# Patient Record
Sex: Female | Born: 1970 | Race: Black or African American | Hispanic: No | Marital: Married | Smoking: Never smoker
Health system: Southern US, Community
[De-identification: ages and names within clinical notes are randomized; demographics above are authoritative.]

## PROBLEM LIST (undated history)

## (undated) DIAGNOSIS — R519 Headache, unspecified: Secondary | ICD-10-CM

## (undated) DIAGNOSIS — T7840XA Allergy, unspecified, initial encounter: Secondary | ICD-10-CM

## (undated) DIAGNOSIS — E7800 Pure hypercholesterolemia, unspecified: Secondary | ICD-10-CM

## (undated) DIAGNOSIS — R51 Headache: Secondary | ICD-10-CM

## (undated) HISTORY — DX: Headache, unspecified: R51.9

## (undated) HISTORY — PX: ABDOMINAL HYSTERECTOMY: SHX81

## (undated) HISTORY — DX: Allergy, unspecified, initial encounter: T78.40XA

## (undated) HISTORY — DX: Headache: R51

---

## 1999-07-29 ENCOUNTER — Emergency Department (HOSPITAL_COMMUNITY): Admission: EM | Admit: 1999-07-29 | Discharge: 1999-07-30 | Payer: Self-pay | Admitting: Emergency Medicine

## 1999-07-30 ENCOUNTER — Encounter: Payer: Self-pay | Admitting: Emergency Medicine

## 2000-03-18 ENCOUNTER — Other Ambulatory Visit: Admission: RE | Admit: 2000-03-18 | Discharge: 2000-03-18 | Payer: Self-pay | Admitting: Obstetrics and Gynecology

## 2000-03-20 ENCOUNTER — Encounter: Admission: RE | Admit: 2000-03-20 | Discharge: 2000-03-20 | Payer: Self-pay | Admitting: Obstetrics and Gynecology

## 2000-03-20 ENCOUNTER — Encounter: Payer: Self-pay | Admitting: Obstetrics and Gynecology

## 2001-06-09 ENCOUNTER — Other Ambulatory Visit: Admission: RE | Admit: 2001-06-09 | Discharge: 2001-06-09 | Payer: Self-pay | Admitting: Obstetrics and Gynecology

## 2002-08-19 ENCOUNTER — Other Ambulatory Visit: Admission: RE | Admit: 2002-08-19 | Discharge: 2002-08-19 | Payer: Self-pay | Admitting: Obstetrics and Gynecology

## 2002-09-23 ENCOUNTER — Encounter: Payer: Self-pay | Admitting: Obstetrics and Gynecology

## 2002-09-23 ENCOUNTER — Encounter: Admission: RE | Admit: 2002-09-23 | Discharge: 2002-09-23 | Payer: Self-pay | Admitting: Obstetrics and Gynecology

## 2003-08-02 ENCOUNTER — Inpatient Hospital Stay (HOSPITAL_COMMUNITY): Admission: RE | Admit: 2003-08-02 | Discharge: 2003-08-04 | Payer: Self-pay | Admitting: Obstetrics and Gynecology

## 2004-06-28 ENCOUNTER — Encounter: Admission: RE | Admit: 2004-06-28 | Discharge: 2004-06-28 | Payer: Self-pay | Admitting: Obstetrics and Gynecology

## 2004-09-01 ENCOUNTER — Emergency Department (HOSPITAL_COMMUNITY): Admission: EM | Admit: 2004-09-01 | Discharge: 2004-09-01 | Payer: Self-pay | Admitting: Family Medicine

## 2010-03-01 ENCOUNTER — Inpatient Hospital Stay (INDEPENDENT_AMBULATORY_CARE_PROVIDER_SITE_OTHER)
Admission: RE | Admit: 2010-03-01 | Discharge: 2010-03-01 | Disposition: A | Discharge status: Home / Self Care | Payer: 59 | Source: Ambulatory Visit | Attending: Family Medicine | Admitting: Family Medicine

## 2010-03-01 DIAGNOSIS — S139XXA Sprain of joints and ligaments of unspecified parts of neck, initial encounter: Secondary | ICD-10-CM

## 2011-03-24 ENCOUNTER — Other Ambulatory Visit: Payer: Self-pay | Admitting: Obstetrics and Gynecology

## 2011-03-24 DIAGNOSIS — Z1231 Encounter for screening mammogram for malignant neoplasm of breast: Secondary | ICD-10-CM

## 2011-04-09 ENCOUNTER — Ambulatory Visit
Admission: RE | Admit: 2011-04-09 | Discharge: 2011-04-09 | Disposition: A | Discharge status: Home / Self Care | Payer: 59 | Source: Ambulatory Visit | Attending: Obstetrics and Gynecology | Admitting: Obstetrics and Gynecology

## 2011-04-09 DIAGNOSIS — Z1231 Encounter for screening mammogram for malignant neoplasm of breast: Secondary | ICD-10-CM

## 2011-04-15 ENCOUNTER — Other Ambulatory Visit: Payer: Self-pay | Admitting: Obstetrics and Gynecology

## 2011-04-15 DIAGNOSIS — R928 Other abnormal and inconclusive findings on diagnostic imaging of breast: Secondary | ICD-10-CM

## 2011-04-18 ENCOUNTER — Ambulatory Visit
Admission: RE | Admit: 2011-04-18 | Discharge: 2011-04-18 | Disposition: A | Discharge status: Home / Self Care | Payer: 59 | Source: Ambulatory Visit | Attending: Obstetrics and Gynecology | Admitting: Obstetrics and Gynecology

## 2011-04-18 DIAGNOSIS — R928 Other abnormal and inconclusive findings on diagnostic imaging of breast: Secondary | ICD-10-CM

## 2012-03-16 ENCOUNTER — Other Ambulatory Visit: Payer: Self-pay | Admitting: Obstetrics and Gynecology

## 2012-04-12 ENCOUNTER — Ambulatory Visit
Admission: RE | Admit: 2012-04-12 | Discharge: 2012-04-12 | Disposition: A | Discharge status: Home / Self Care | Payer: 59 | Source: Ambulatory Visit | Attending: Obstetrics and Gynecology | Admitting: Obstetrics and Gynecology

## 2012-04-12 DIAGNOSIS — Z1231 Encounter for screening mammogram for malignant neoplasm of breast: Secondary | ICD-10-CM

## 2012-04-14 ENCOUNTER — Other Ambulatory Visit: Payer: Self-pay | Admitting: Obstetrics and Gynecology

## 2012-04-14 DIAGNOSIS — R928 Other abnormal and inconclusive findings on diagnostic imaging of breast: Secondary | ICD-10-CM

## 2012-04-26 ENCOUNTER — Ambulatory Visit
Admission: RE | Admit: 2012-04-26 | Discharge: 2012-04-26 | Disposition: A | Discharge status: Home / Self Care | Payer: 59 | Source: Ambulatory Visit | Attending: Obstetrics and Gynecology | Admitting: Obstetrics and Gynecology

## 2012-04-26 ENCOUNTER — Other Ambulatory Visit: Payer: Self-pay | Admitting: Obstetrics and Gynecology

## 2012-04-26 DIAGNOSIS — R928 Other abnormal and inconclusive findings on diagnostic imaging of breast: Secondary | ICD-10-CM

## 2013-04-20 ENCOUNTER — Other Ambulatory Visit: Payer: Self-pay | Admitting: Family Medicine

## 2013-04-20 ENCOUNTER — Ambulatory Visit
Admission: RE | Admit: 2013-04-20 | Discharge: 2013-04-20 | Disposition: A | Discharge status: Home / Self Care | Payer: 59 | Source: Ambulatory Visit | Attending: Family Medicine | Admitting: Family Medicine

## 2013-04-20 DIAGNOSIS — R0602 Shortness of breath: Secondary | ICD-10-CM

## 2013-06-07 ENCOUNTER — Other Ambulatory Visit: Payer: Self-pay | Admitting: Obstetrics and Gynecology

## 2013-06-07 DIAGNOSIS — N63 Unspecified lump in unspecified breast: Secondary | ICD-10-CM

## 2013-06-27 ENCOUNTER — Encounter (INDEPENDENT_AMBULATORY_CARE_PROVIDER_SITE_OTHER): Payer: Self-pay

## 2013-06-27 ENCOUNTER — Ambulatory Visit
Admission: RE | Admit: 2013-06-27 | Discharge: 2013-06-27 | Disposition: A | Discharge status: Home / Self Care | Payer: 59 | Source: Ambulatory Visit | Attending: Obstetrics and Gynecology | Admitting: Obstetrics and Gynecology

## 2013-06-27 DIAGNOSIS — N63 Unspecified lump in unspecified breast: Secondary | ICD-10-CM

## 2013-08-11 ENCOUNTER — Other Ambulatory Visit: Payer: Self-pay | Admitting: Family Medicine

## 2013-08-11 DIAGNOSIS — M542 Cervicalgia: Secondary | ICD-10-CM

## 2013-08-17 ENCOUNTER — Ambulatory Visit
Admission: RE | Admit: 2013-08-17 | Discharge: 2013-08-17 | Disposition: A | Discharge status: Home / Self Care | Payer: 59 | Source: Ambulatory Visit | Attending: Family Medicine | Admitting: Family Medicine

## 2013-08-17 DIAGNOSIS — M542 Cervicalgia: Secondary | ICD-10-CM

## 2013-08-19 ENCOUNTER — Other Ambulatory Visit: Payer: 59

## 2014-08-07 ENCOUNTER — Encounter (HOSPITAL_COMMUNITY): Payer: Self-pay | Admitting: Emergency Medicine

## 2014-08-07 ENCOUNTER — Emergency Department (HOSPITAL_COMMUNITY)
Admission: EM | Admit: 2014-08-07 | Discharge: 2014-08-07 | Disposition: A | Discharge status: Home / Self Care | Payer: 59 | Source: Home / Self Care | Attending: Family Medicine | Admitting: Family Medicine

## 2014-08-07 DIAGNOSIS — J301 Allergic rhinitis due to pollen: Secondary | ICD-10-CM

## 2014-08-07 LAB — POCT RAPID STREP A: Streptococcus, Group A Screen (Direct): NEGATIVE

## 2014-08-07 MED ORDER — IPRATROPIUM BROMIDE 0.06 % NA SOLN: 2.0000 | Freq: Four times a day (QID) | NASAL | Status: DC

## 2014-08-07 NOTE — ED Notes (Signed)
C/o cold sx onset 5 days Sx include ST, cough, nasal congestion, HA Denies fevers, chills Alert, no signs of acute distress.

## 2014-08-07 NOTE — Discharge Instructions (Signed)
Allergic Rhinitis May start taking Allegra 180 mg daily, Claritin 10 mg or Zyrtec 10 mg daily. (Just 1 of the receding). Flonase nasal spray daily Use lots of saline nasal spray and frequently For congestion may use Sudafed 10 PE 10 mg as needed Atrovent nasal spray as directed Allergic rhinitis is when the mucous membranes in the nose respond to allergens. Allergens are particles in the air that cause your body to have an allergic reaction. This causes you to release allergic antibodies. Through a chain of events, these eventually cause you to release histamine into the blood stream. Although meant to protect the body, it is this release of histamine that causes your discomfort, such as frequent sneezing, congestion, and an itchy, runny nose.  CAUSES  Seasonal allergic rhinitis (hay fever) is caused by pollen allergens that may come from grasses, trees, and weeds. Year-round allergic rhinitis (perennial allergic rhinitis) is caused by allergens such as house dust mites, pet dander, and mold spores.  SYMPTOMS   Nasal stuffiness (congestion).  Itchy, runny nose with sneezing and tearing of the eyes. DIAGNOSIS  Your health care provider can help you determine the allergen or allergens that trigger your symptoms. If you and your health care provider are unable to determine the allergen, skin or blood testing may be used. TREATMENT  Allergic rhinitis does not have a cure, but it can be controlled by:  Medicines and allergy shots (immunotherapy).  Avoiding the allergen. Hay fever may often be treated with antihistamines in pill or nasal spray forms. Antihistamines block the effects of histamine. There are over-the-counter medicines that may help with nasal congestion and swelling around the eyes. Check with your health care provider before taking or giving this medicine.  If avoiding the allergen or the medicine prescribed do not work, there are many new medicines your health care provider can  prescribe. Stronger medicine may be used if initial measures are ineffective. Desensitizing injections can be used if medicine and avoidance does not work. Desensitization is when a patient is given ongoing shots until the body becomes less sensitive to the allergen. Make sure you follow up with your health care provider if problems continue. HOME CARE INSTRUCTIONS It is not possible to completely avoid allergens, but you can reduce your symptoms by taking steps to limit your exposure to them. It helps to know exactly what you are allergic to so that you can avoid your specific triggers. SEEK MEDICAL CARE IF:   You have a fever.  You develop a cough that does not stop easily (persistent).  You have shortness of breath.  You start wheezing.  Symptoms interfere with normal daily activities. Document Released: 10/01/2000 Document Revised: 01/11/2013 Document Reviewed: 09/13/2012 [facility] Patient Information 2015 [place], [place]. This information is not intended to replace advice given to you by your health care provider. Make sure you discuss any questions you have with your health care provider.

## 2014-08-07 NOTE — ED Provider Notes (Signed)
CSN: [id number]     Arrival date & time 08/07/14  1309 History   First MD Initiated Contact with Patient 08/07/14 1500     Chief Complaint  Patient presents with  . URI   (Consider location/radiation/quality/duration/timing/severity/associated sxs/prior Treatment) HPI Comments: 44 year old female presents with allergy symptoms for 4 days.   History reviewed. No pertinent past medical history. History reviewed. No pertinent past surgical history. No family history on file. History  Substance Use Topics  . Smoking status: Never Smoker   . Smokeless tobacco: Not on file  . Alcohol Use: No   OB History    No data available     Review of Systems  Constitutional: Negative for fever, chills, activity change, appetite change and fatigue.  HENT: Positive for congestion, postnasal drip, rhinorrhea and sneezing. Negative for facial swelling.   Eyes: Negative.   Respiratory: Positive for cough.   Cardiovascular: Negative.   Musculoskeletal: Negative for neck pain and neck stiffness.  Skin: Negative for pallor and rash.  Neurological: Negative.     Allergies  Review of patient's allergies indicates no known allergies.  Home Medications   Prior to Admission medications   Medication Sig Start Date End Date Taking? Authorizing Provider  ipratropium (ATROVENT) 0.06 % nasal spray Place 2 sprays into both nostrils 4 (four) times daily. 08/07/14   Hayden Rasmussenavid Sylver Vantassell, NP   There were no vitals taken for this visit. Physical Exam  Constitutional: She is oriented to person, place, and time. She appears well-developed and well-nourished. No distress.  HENT:  Bilateral TMs are normal Unable to visualize the oropharynx due to patient's forceful retraction of the tongue  Eyes: Conjunctivae and EOM are normal.  Neck: Normal range of motion. Neck supple.  Cardiovascular: Normal rate, regular rhythm and normal heart sounds.   Pulmonary/Chest: Effort normal and breath sounds normal. No respiratory  distress. She has no wheezes. She has no rales.  Musculoskeletal: Normal range of motion. She exhibits no edema.  Lymphadenopathy:    She has no cervical adenopathy.  Neurological: She is alert and oriented to person, place, and time.  Skin: Skin is warm and dry.  Psychiatric: She has a normal mood and affect.  Nursing note and vitals reviewed.   ED Course  Procedures (including critical care time) Labs Review Labs Reviewed  POCT RAPID STREP A    Imaging Review No results found.   MDM   1. Allergic rhinitis due to pollen     May start taking Allegra 180 mg daily, Claritin 10 mg or Zyrtec 10 mg daily. (Just 1 of the receding). Flonase nasal spray daily Use lots of saline nasal spray and frequently For congestion may use Sudafed 10 PE 10 mg as needed Atrovent nasal spray as directed    Hayden Rasmussenavid Anaily Ashbaugh, NP 08/07/14 1517

## 2014-08-09 LAB — CULTURE, GROUP A STREP: STREP A CULTURE: NEGATIVE

## 2015-05-26 ENCOUNTER — Ambulatory Visit (HOSPITAL_COMMUNITY)
Admission: EM | Admit: 2015-05-26 | Discharge: 2015-05-26 | Disposition: A | Discharge status: Home / Self Care | Payer: BLUE CROSS/BLUE SHIELD | Attending: Family Medicine | Admitting: Family Medicine

## 2015-05-26 ENCOUNTER — Encounter (HOSPITAL_COMMUNITY): Payer: Self-pay | Admitting: Emergency Medicine

## 2015-05-26 DIAGNOSIS — N390 Urinary tract infection, site not specified: Secondary | ICD-10-CM | POA: Diagnosis not present

## 2015-05-26 LAB — POCT URINALYSIS DIP (DEVICE)
Bilirubin Urine: NEGATIVE
Glucose, UA: NEGATIVE mg/dL
Ketones, ur: NEGATIVE mg/dL
NITRITE: NEGATIVE
PH: 5 (ref 5.0–8.0)
PROTEIN: 30 mg/dL — AB
SPECIFIC GRAVITY, URINE: 1.02 (ref 1.005–1.030)
UROBILINOGEN UA: 0.2 mg/dL (ref 0.0–1.0)

## 2015-05-26 MED ORDER — CEPHALEXIN 500 MG PO CAPS: 500.0000 mg | ORAL_CAPSULE | Freq: Four times a day (QID) | ORAL | Status: DC

## 2015-05-26 NOTE — ED Notes (Signed)
History of uti.  Low pressure, frequent urination, pain at the end of stream.  Onset yesterday of symptoms.  Recently went on a cruise, but did not drink water as usual

## 2015-05-26 NOTE — ED Provider Notes (Signed)
CSN: [id number]     Arrival date & time 05/26/15  1348 History   First MD Initiated Contact with Patient 05/26/15 1549     Chief Complaint  Patient presents with  . Urinary Tract Infection   (Consider location/radiation/quality/duration/timing/severity/associated sxs/prior Treatment) Patient is a 45 y.o. female presenting with urinary tract infection. The history is provided by the patient.  Urinary Tract Infection Pain quality:  Burning Pain severity:  Moderate Onset quality:  Gradual Duration:  1 day Progression:  Unchanged Chronicity:  New Recent urinary tract infections: no   Relieved by:  None tried Worsened by:  Nothing tried Ineffective treatments:  None tried Urinary symptoms: foul-smelling urine and frequent urination   Associated symptoms: no abdominal pain, no fever, no flank pain, no nausea, no vaginal discharge and no vomiting   Risk factors: sexually active   Risk factors: no recurrent urinary tract infections     History reviewed. No pertinent past medical history. Past Surgical History  Procedure Laterality Date  . Abdominal hysterectomy     No family history on file. Social History  Substance Use Topics  . Smoking status: Never Smoker   . Smokeless tobacco: None  . Alcohol Use: No   OB History    No data available     Review of Systems  Constitutional: Negative.  Negative for fever.  Gastrointestinal: Negative.  Negative for nausea, vomiting and abdominal pain.  Genitourinary: Positive for dysuria, urgency and frequency. Negative for flank pain, vaginal discharge, menstrual problem and pelvic pain.  All other systems reviewed and are negative.   Allergies  Codeine  Home Medications   Prior to Admission medications   Medication Sig Start Date End Date Taking? Authorizing Provider  ipratropium (ATROVENT) 0.06 % nasal spray Place 2 sprays into both nostrils 4 (four) times daily. 08/07/14   Hayden Rasmussenavid Mabe, NP   Meds Ordered and Administered this Visit   Medications - No data to display  BP 111/82 mmHg  Pulse 76  Temp(Src) 98.1 F (36.7 C) (Oral)  Resp 18  SpO2 100% No data found.   Physical Exam  Constitutional: She appears well-developed and well-nourished. No distress.  Abdominal: Soft. Bowel sounds are normal. She exhibits no distension and no mass. There is no tenderness. There is no rebound and no guarding.  Neurological: She is alert.  Skin: Skin is warm.  Nursing note and vitals reviewed.   ED Course  Procedures (including critical care time)  Labs Review Labs Reviewed  POCT URINALYSIS DIP (DEVICE) - Abnormal; Notable for the following:    Hgb urine dipstick MODERATE (*)    Protein, ur 30 (*)    Leukocytes, UA MODERATE (*)    All other components within normal limits    Imaging Review No results found.   Visual Acuity Review  Right Eye Distance:   Left Eye Distance:   Bilateral Distance:    Right Eye Near:   Left Eye Near:    Bilateral Near:         MDM  No diagnosis found.  Meds ordered this encounter  Medications  . cephALEXin (KEFLEX) 500 MG capsule    Sig: Take 1 capsule (500 mg total) by mouth 4 (four) times daily. Take all of medicine and drink lots of fluids    Dispense:  20 capsule    Refill:  0      Linna HoffJames D Adileny Delon, MD 05/26/15 1622

## 2015-05-26 NOTE — Discharge Instructions (Signed)
Take all of medicine as directed, drink lots of fluids, see your doctor if further problems. °

## 2016-07-10 ENCOUNTER — Encounter: Payer: Self-pay | Admitting: Podiatry

## 2016-07-10 ENCOUNTER — Ambulatory Visit (INDEPENDENT_AMBULATORY_CARE_PROVIDER_SITE_OTHER): Payer: BLUE CROSS/BLUE SHIELD | Admitting: Podiatry

## 2016-07-10 DIAGNOSIS — B351 Tinea unguium: Secondary | ICD-10-CM

## 2016-07-10 NOTE — Patient Instructions (Signed)

## 2016-07-10 NOTE — Progress Notes (Signed)
   Subjective:    Patient ID: Elizabeth Cross, female    DOB: 10/13/1970, 46 y.o.   MRN: [id number]  HPI   46 year old female presents the office they for concerns of possible toenail fungus. She states that she had a pedicure 1.5 months ago she certainly discoloration to the toes. She's tried over-the-counter treatment and significant improvement. She tried Contractorro-Clearz. She denies any pain in the nails denies any redness or drainage or any swelling. She is concerned with the aesthetics of the nail. She has no other concerns.  Review of Systems  Skin: Positive for rash.  All other systems reviewed and are negative.      Objective:   Physical Exam General: AAO x3, NAD  Dermatological: Nails are very be somewhat dystrophic, discolored yellow-brown discoloration. There is no swelling or redness or drainage. There is no pain to the toenails. There is no conical signs of infection. No open lesions identified.  Vascular: Dorsalis Pedis artery and Posterior Tibial artery pedal pulses are 2/4 bilateral with immedate capillary fill time. Pedal hair growth present.  There is no pain with calf compression, swelling, warmth, erythema.   Neruologic: Grossly intact via light touch bilateral. Protective threshold with Semmes Wienstein monofilament intact to all pedal sites bilateral.   Musculoskeletal: No gross boney pedal deformities bilateral. No pain, crepitus, or limitation noted with foot and ankle range of motion bilateral. Muscular strength 5/5 in all groups tested bilateral.  Gait: Unassisted, Nonantalgic.     Assessment & Plan:  46 year old female with onychomycosis -Treatment options discussed including all alternatives, risks, and complications -Etiology of symptoms were discussed -Discussed multiple treatment options for nail fungus. She was still any oral medication. Ordered a compound cream and [facility] for [facility]. Application instructions were  discussed. -Follow-up as needed. Symptoms not improve next 6 months to call the office.  Ovid CurdMatthew Wagoner, DPM

## 2016-07-17 DIAGNOSIS — B351 Tinea unguium: Secondary | ICD-10-CM | POA: Insufficient documentation

## 2016-07-29 ENCOUNTER — Ambulatory Visit (HOSPITAL_COMMUNITY)
Admission: EM | Admit: 2016-07-29 | Discharge: 2016-07-29 | Disposition: A | Discharge status: Home / Self Care | Payer: BLUE CROSS/BLUE SHIELD | Attending: Family Medicine | Admitting: Family Medicine

## 2016-07-29 ENCOUNTER — Encounter (HOSPITAL_COMMUNITY): Payer: Self-pay | Admitting: Emergency Medicine

## 2016-07-29 ENCOUNTER — Ambulatory Visit (INDEPENDENT_AMBULATORY_CARE_PROVIDER_SITE_OTHER): Payer: BLUE CROSS/BLUE SHIELD

## 2016-07-29 DIAGNOSIS — M25461 Effusion, right knee: Secondary | ICD-10-CM

## 2016-07-29 HISTORY — DX: Pure hypercholesterolemia, unspecified: E78.00

## 2016-07-29 MED ORDER — IBUPROFEN 600 MG PO TABS: 600.0000 mg | ORAL_TABLET | Freq: Four times a day (QID) | ORAL | 0 refills | Status: DC | PRN

## 2016-07-29 NOTE — ED Provider Notes (Signed)
CSN: [id number]     Arrival date & time 07/29/16  1624 History   None    Chief Complaint  Patient presents with  . Knee Pain   (Consider location/radiation/quality/duration/timing/severity/associated sxs/prior Treatment) Pt complains of pain in her right knee.  Pt is worried that pain comes from wear wedges.     The history is provided by the patient. No language interpreter was used.  Knee Pain  Location:  Knee Injury: no   Knee location:  R knee Pain details:    Quality:  Aching   Severity:  Moderate   Onset quality:  Gradual   Timing:  Constant   Progression:  Worsening Dislocation: no   Foreign body present:  No foreign bodies Tetanus status:  Up to date Relieved by:  Nothing Worsened by:  Nothing Ineffective treatments:  None tried Risk factors: no concern for non-accidental trauma and no recent illness     Past Medical History:  Diagnosis Date  . Allergy   . Frequent headaches   . High cholesterol    Past Surgical History:  Procedure Laterality Date  . ABDOMINAL HYSTERECTOMY     No family history on file. Social History  Substance Use Topics  . Smoking status: Never Smoker  . Smokeless tobacco: Never Used  . Alcohol use No   OB History    No data available     Review of Systems  All other systems reviewed and are negative.   Allergies  Codeine  Home Medications   Prior to Admission medications   Medication Sig Start Date End Date Taking? Authorizing Provider  rosuvastatin (CRESTOR) 5 MG tablet Take 5 mg by mouth daily. 04/22/16  Yes [provider]  cephALEXin (KEFLEX) 500 MG capsule Take 1 capsule (500 mg total) by mouth 4 (four) times daily. Take all of medicine and drink lots of fluids Patient not taking: Reported on 07/10/2016 05/26/15   Linna HoffKindl, James D, MD  clotrimazole-betamethasone (LOTRISONE) cream Apply 1 application topically 2 (two) times daily.    [provider]  ibuprofen (ADVIL,MOTRIN) 600 MG tablet Take 1 tablet (600  mg total) by mouth every 6 (six) hours as needed. 07/29/16   Elson AreasSofia, Johara Lodwick K, PA-C  ipratropium (ATROVENT) 0.06 % nasal spray Place 2 sprays into both nostrils 4 (four) times daily. Patient not taking: Reported on 07/10/2016 08/07/14   Hayden RasmussenMabe, David, NP  NONFORMULARY OR COMPOUNDED ITEM [facility]  Onychomycosis Nail Lacquer -  Fluconazole 2%, Terbinafine 1% DMSO Apply to affected nail once daily Qty. 120 gm 3 refills    [provider]   Meds Ordered and Administered this Visit  Medications - No data to display  BP 100/66 (BP Location: Right Arm)   Pulse 68   Temp 98.6 F (37 C) (Oral)   Resp 12   SpO2 99%  No data found.   Physical Exam  Constitutional: She appears well-developed and well-nourished.  HENT:  Head: Normocephalic.  Musculoskeletal: She exhibits tenderness.  Tender right knee to palpation,  Pain with range of motion,    Neurological: She is alert.  Skin: Skin is warm.  Psychiatric: She has a normal mood and affect.  Nursing note and vitals reviewed.   Urgent Care Course     Procedures (including critical care time)  Labs Review Labs Reviewed - No data to display  Imaging Review Dg Knee Complete 4 Views Right  Result Date: 07/29/2016 CLINICAL DATA:  Right knee pain for 1 month, inferior to the patella EXAM:  RIGHT KNEE - COMPLETE 4+ VIEW COMPARISON:  None. FINDINGS: No evidence of fracture, or dislocation. Minimal joint effusion. No evidence of arthropathy or other focal bone abnormality. Soft tissues are unremarkable. IMPRESSION: Minimal joint effusion.  No acute osseous abnormality. Electronically Signed   By: Jasmine Pang M.D.   On: 07/29/2016 18:18     Visual Acuity Review  Right Eye Distance:   Left Eye Distance:   Bilateral Distance:    Right Eye Near:   Left Eye Near:    Bilateral Near:         MDM   1. Effusion of right knee    Knee sleeve Meds ordered this encounter  Medications  . ibuprofen (ADVIL,MOTRIN) 600 MG  tablet    Sig: Take 1 tablet (600 mg total) by mouth every 6 (six) hours as needed.    Dispense:  30 tablet    Refill:  0    Order Specific Question:   Supervising Provider    Answer:   Liane Comber, PA-C 07/29/16 2005

## 2016-07-29 NOTE — ED Triage Notes (Signed)
Patient reports that a month ago she noticed right knee is painful with movement.  Gradually pain has worsened in right knee and recently noticed tightening in back of thighs, bilaterally.  Patient reports she does wear "wedges" most of the time

## 2018-05-31 IMAGING — DX DG KNEE COMPLETE 4+V*R*
4 series · 4 of 4 positions shown · non-contrast
Comparison: None.

CLINICAL DATA: Right knee pain for 1 month, inferior to the patella

EXAM:
RIGHT KNEE - COMPLETE 4+ VIEW

[knee ap]
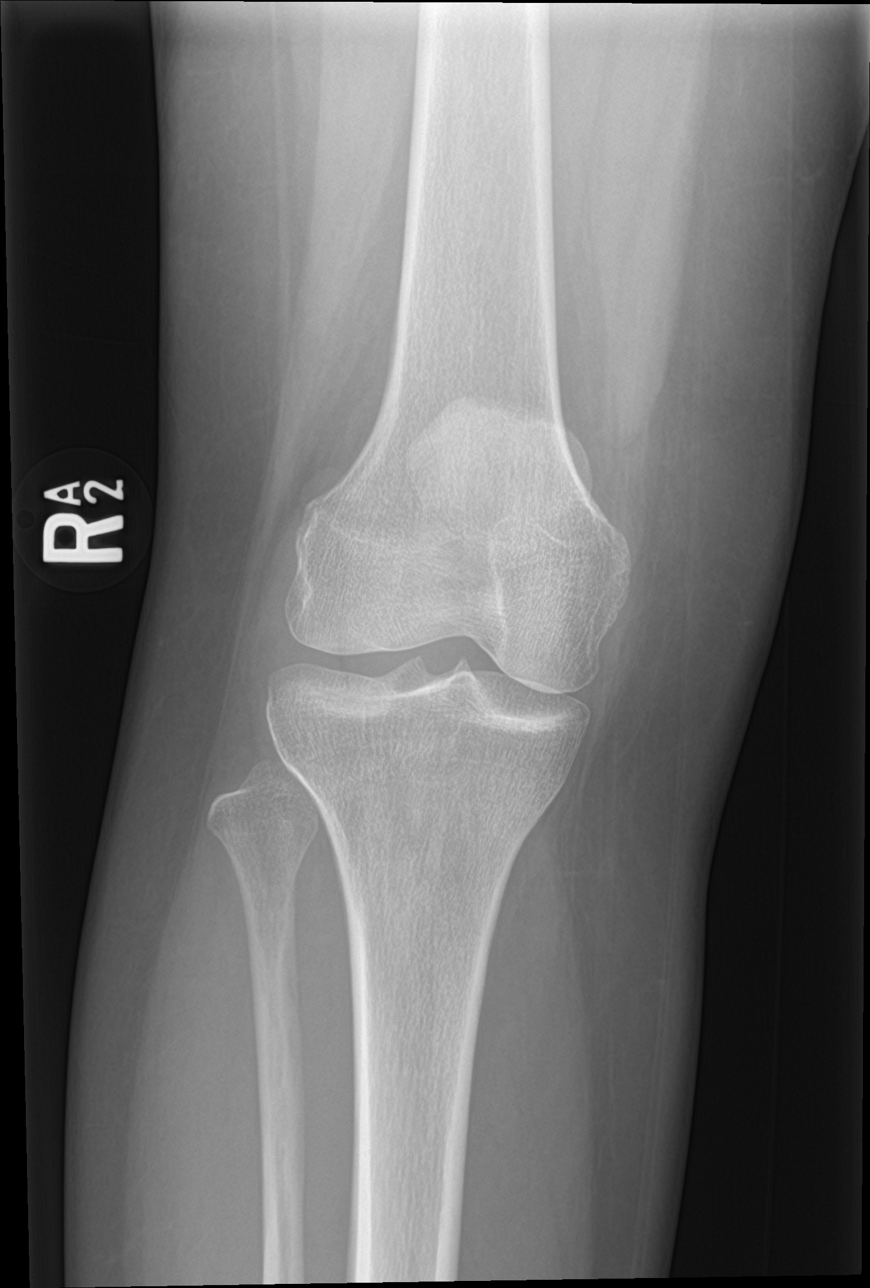

[knee obl (1 of 2)]
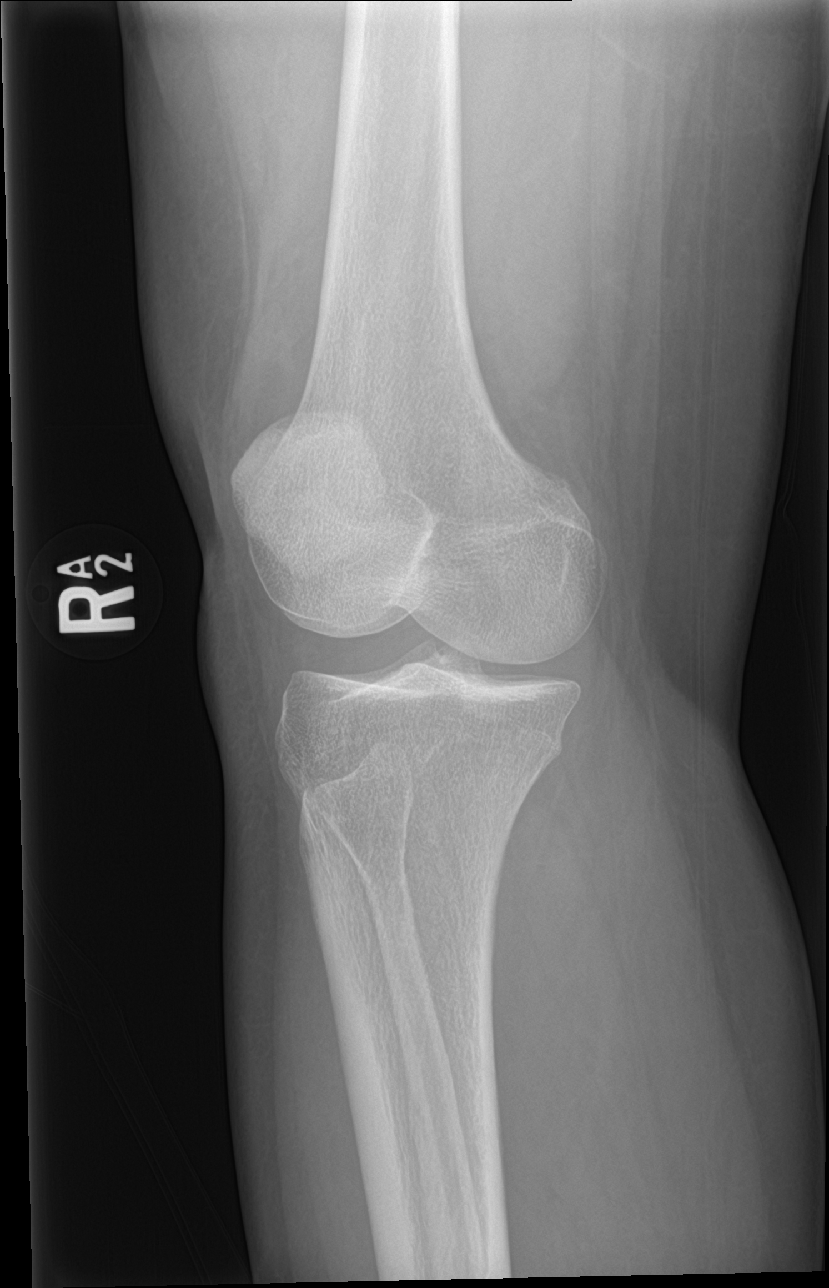

[knee obl (2 of 2)]
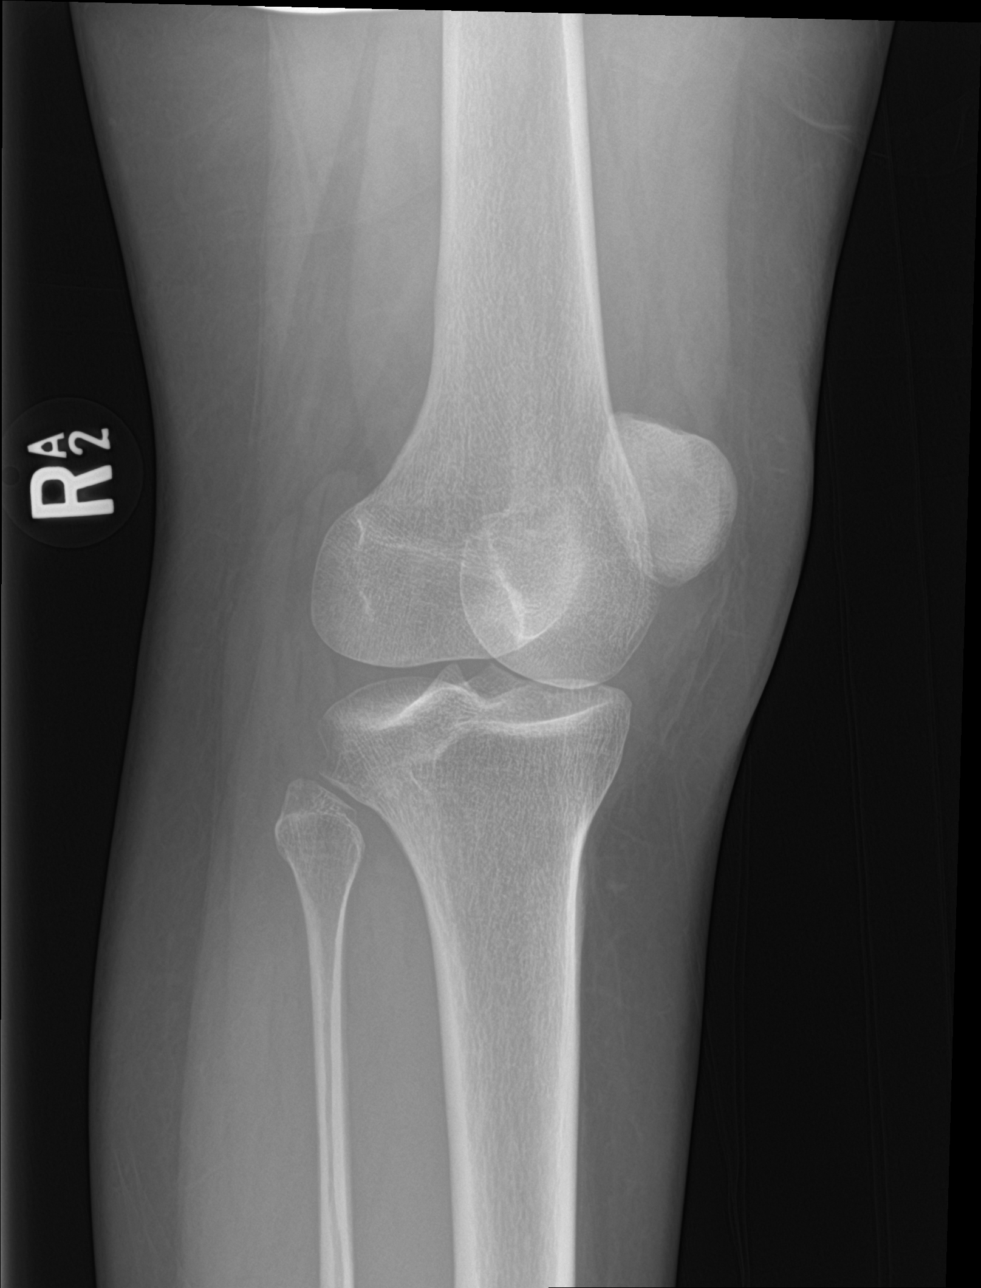

[knee lat]
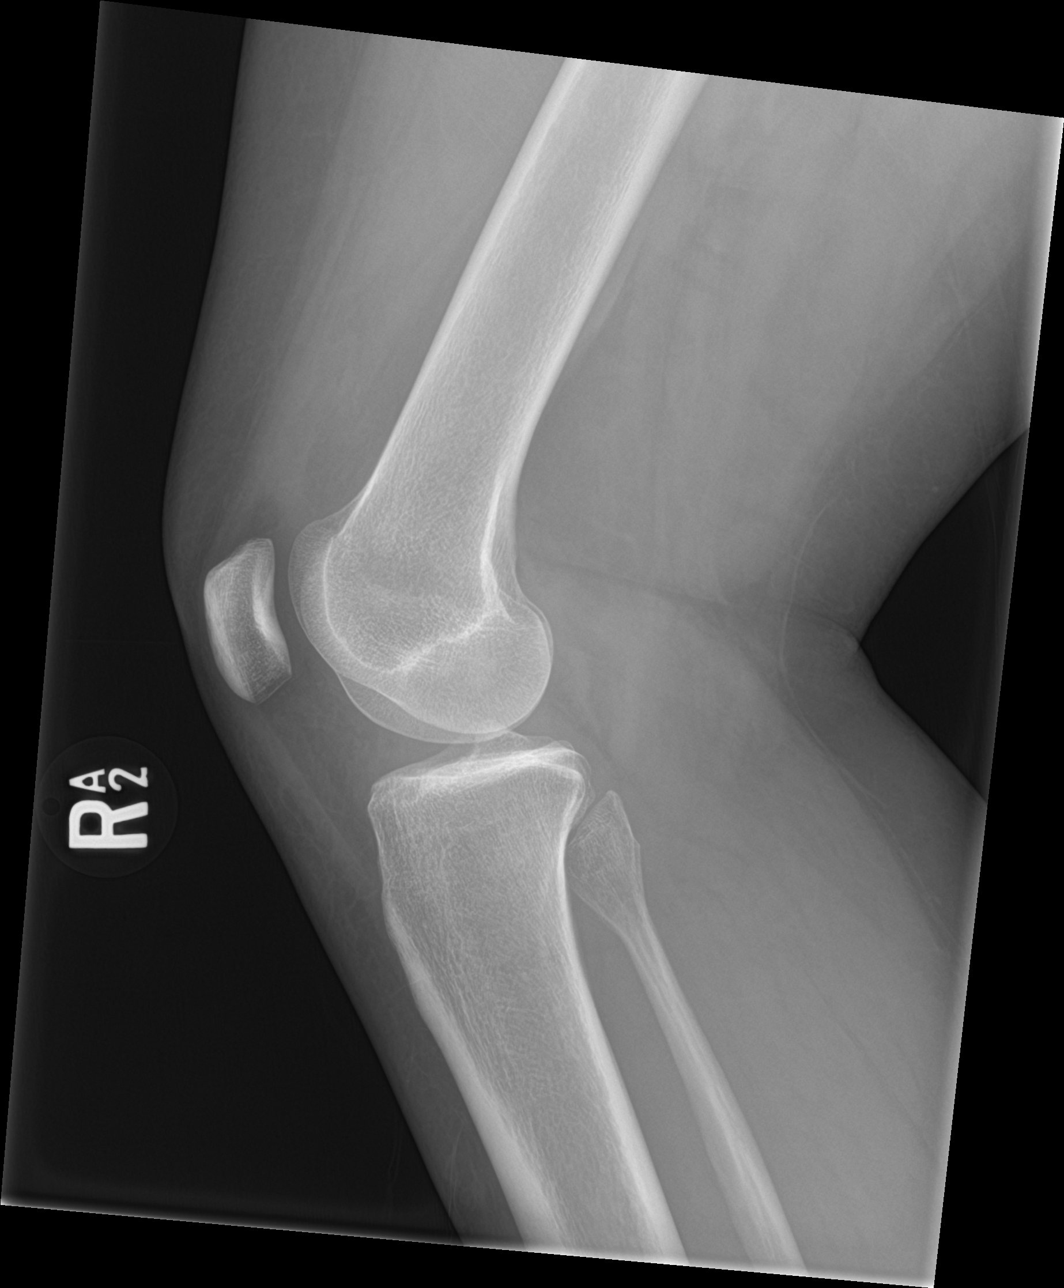

[4 of 4 positions shown; findings below may reference images not displayed]

FINDINGS: No evidence of fracture, or dislocation. Minimal joint effusion. No
evidence of arthropathy or other focal bone abnormality. Soft
tissues are unremarkable.
IMPRESSION: Minimal joint effusion.  No acute osseous abnormality.

## 2019-02-19 ENCOUNTER — Other Ambulatory Visit: Payer: Self-pay | Admitting: *Deleted

## 2019-02-19 DIAGNOSIS — Z20822 Contact with and (suspected) exposure to covid-19: Secondary | ICD-10-CM

## 2019-02-20 LAB — NOVEL CORONAVIRUS, NAA: SARS-CoV-2, NAA: NOT DETECTED

## 2019-04-14 ENCOUNTER — Other Ambulatory Visit: Payer: Self-pay | Admitting: Family Medicine

## 2019-04-14 DIAGNOSIS — Z8249 Family history of ischemic heart disease and other diseases of the circulatory system: Secondary | ICD-10-CM

## 2019-04-30 ENCOUNTER — Ambulatory Visit
Admission: RE | Admit: 2019-04-30 | Discharge: 2019-04-30 | Disposition: A | Discharge status: Home / Self Care | Payer: BC Managed Care – PPO | Source: Ambulatory Visit | Attending: Family Medicine | Admitting: Family Medicine

## 2019-04-30 ENCOUNTER — Other Ambulatory Visit: Payer: Self-pay | Admitting: Family Medicine

## 2019-04-30 DIAGNOSIS — Z8249 Family history of ischemic heart disease and other diseases of the circulatory system: Secondary | ICD-10-CM

## 2019-04-30 MED ORDER — GADOBENATE DIMEGLUMINE 529 MG/ML IV SOLN
15.0000 mL | Freq: Once | INTRAVENOUS | Status: AC | PRN
  Administered 2019-04-30: 15 mL via INTRAVENOUS

## 2019-07-02 ENCOUNTER — Encounter (HOSPITAL_COMMUNITY): Payer: Self-pay

## 2019-07-02 ENCOUNTER — Other Ambulatory Visit: Payer: Self-pay

## 2019-07-02 ENCOUNTER — Ambulatory Visit (HOSPITAL_COMMUNITY)
Admission: EM | Admit: 2019-07-02 | Discharge: 2019-07-02 | Disposition: A | Discharge status: Home / Self Care | Payer: BC Managed Care – PPO | Attending: Emergency Medicine | Admitting: Emergency Medicine

## 2019-07-02 DIAGNOSIS — R067 Sneezing: Secondary | ICD-10-CM | POA: Diagnosis present

## 2019-07-02 DIAGNOSIS — E7800 Pure hypercholesterolemia, unspecified: Secondary | ICD-10-CM | POA: Diagnosis not present

## 2019-07-02 DIAGNOSIS — B9789 Other viral agents as the cause of diseases classified elsewhere: Secondary | ICD-10-CM | POA: Insufficient documentation

## 2019-07-02 DIAGNOSIS — Z79899 Other long term (current) drug therapy: Secondary | ICD-10-CM | POA: Insufficient documentation

## 2019-07-02 DIAGNOSIS — J019 Acute sinusitis, unspecified: Secondary | ICD-10-CM | POA: Diagnosis not present

## 2019-07-02 DIAGNOSIS — Z20822 Contact with and (suspected) exposure to covid-19: Secondary | ICD-10-CM | POA: Insufficient documentation

## 2019-07-02 LAB — SARS CORONAVIRUS 2 (TAT 6-24 HRS): SARS Coronavirus 2: NEGATIVE

## 2019-07-02 NOTE — Discharge Instructions (Signed)
Try ibuprofen for pain. Continue with Sudafed to help break up mucus. You may also try afrin and/or Neti pots to help with symptoms. If symptoms last longer than 14 day or if you begin to develop fevers you may need to be reevaluated for change to bacterial infection.

## 2019-07-02 NOTE — ED Triage Notes (Signed)
Pt presents with nasal pressure, sneezing, and congestion X 2 days.

## 2019-07-02 NOTE — ED Provider Notes (Signed)
[facility]    CSN: [id number] Arrival date & time: 07/02/19  1145      History   Chief Complaint Chief Complaint  Patient presents with  . Congestion  . Sinus Pressure  . Sneezing    HPI Elizabeth Cross is a 49 y.o. female. She presents with nasal congestion, scratchy throat, frontal headache, mild cough and ear fullness. Symptoms began about 5 days ago. No known contacts but she did go to a BBQ last weekend. Denies fever or neck pain.   HPI  Past Medical History:  Diagnosis Date  . Allergy   . Frequent headaches   . High cholesterol     Patient Active Problem List   Diagnosis Date Noted  . Onychomycosis 07/17/2016    Past Surgical History:  Procedure Laterality Date  . ABDOMINAL HYSTERECTOMY      OB History   No obstetric history on file.      Home Medications    Prior to Admission medications   Medication Sig Start Date End Date Taking? Authorizing Provider  cephALEXin (KEFLEX) 500 MG capsule Take 1 capsule (500 mg total) by mouth 4 (four) times daily. Take all of medicine and drink lots of fluids Patient not taking: Reported on 07/10/2016 05/26/15   Linna Hoff, MD  clotrimazole-betamethasone (LOTRISONE) cream Apply 1 application topically 2 (two) times daily.    [provider]  ibuprofen (ADVIL,MOTRIN) 600 MG tablet Take 1 tablet (600 mg total) by mouth every 6 (six) hours as needed. 07/29/16   Elson Areas, PA-C  ipratropium (ATROVENT) 0.06 % nasal spray Place 2 sprays into both nostrils 4 (four) times daily. Patient not taking: Reported on 07/10/2016 08/07/14   Hayden Rasmussen, NP  NONFORMULARY OR COMPOUNDED ITEM [facility]  Onychomycosis Nail Lacquer -  Fluconazole 2%, Terbinafine 1% DMSO Apply to affected nail once daily Qty. 120 gm 3 refills    [provider]  rosuvastatin (CRESTOR) 5 MG tablet Take 5 mg by mouth daily. 04/22/16   [provider]    Family History Family History  Family history  unknown: Yes    Social History Social History   Tobacco Use  . Smoking status: Never Smoker  . Smokeless tobacco: Never Used  Substance Use Topics  . Alcohol use: No  . Drug use: No     Allergies   Codeine   Review of Systems Review of Systems  Constitutional: Positive for fatigue. Negative for chills and fever.  HENT: Positive for postnasal drip, rhinorrhea, sinus pressure, sinus pain, sneezing and sore throat. Negative for ear discharge and ear pain.   Respiratory: Positive for cough. Negative for shortness of breath and wheezing.   Cardiovascular: Negative for chest pain.  Gastrointestinal: Negative for abdominal pain, nausea and vomiting.  Musculoskeletal: Negative for back pain and myalgias.  Neurological: Positive for headaches. Negative for dizziness and light-headedness.  Hematological: Does not bruise/bleed easily.  All other systems reviewed and are negative.    Physical Exam Triage Vital Signs ED Triage Vitals  Enc Vitals Group     BP 07/02/19 1228 119/79     Pulse Rate 07/02/19 1228 74     Resp 07/02/19 1228 17     Temp 07/02/19 1228 99.3 F (37.4 C)     Temp Source 07/02/19 1228 Oral     SpO2 07/02/19 1228 100 %     Weight --      Height --      Head Circumference --  Peak Flow --      Pain Score 07/02/19 1230 4     Pain Loc --      Pain Edu? --      Excl. in [place]? --    No data found.  Updated Vital Signs BP 119/79 (BP Location: Right Arm)   Pulse 74   Temp 99.3 F (37.4 C) (Oral)   Resp 17   SpO2 100%   Visual Acuity Right Eye Distance:   Left Eye Distance:   Bilateral Distance:    Right Eye Near:   Left Eye Near:    Bilateral Near:     Physical Exam Constitutional:      General: She is not in acute distress.    Appearance: Normal appearance. She is normal weight. She is not ill-appearing.  HENT:     Head: Normocephalic and atraumatic.     Right Ear: There is impacted cerumen.     Left Ear: There is no impacted cerumen.    Cardiovascular:     Rate and Rhythm: Normal rate and regular rhythm.     Heart sounds: No murmur heard.  No friction rub. No gallop.   Pulmonary:     Effort: Pulmonary effort is normal. No respiratory distress.     Breath sounds: Normal breath sounds. No stridor. No wheezing, rhonchi or rales.  Musculoskeletal:        General: No swelling. Normal range of motion.     Cervical back: Normal range of motion and neck supple. No rigidity or tenderness.  Lymphadenopathy:     Cervical: No cervical adenopathy.  Skin:    General: Skin is warm.     Coloration: Skin is not jaundiced or pale.  Neurological:     Mental Status: She is alert.     Gait: Gait normal.  Psychiatric:        Mood and Affect: Mood normal.        Behavior: Behavior normal.        Thought Content: Thought content normal.        Judgment: Judgment normal.      UC Treatments / Results  Labs (all labs ordered are listed, but only abnormal results are displayed) Labs Reviewed  SARS CORONAVIRUS 2 (TAT 6-24 HRS)    EKG   Radiology No results found.  Procedures Procedures (including critical care time)  Medications Ordered in UC Medications - No data to display  Initial Impression / Assessment and Plan / UC Course  I have reviewed the triage vital signs and the nursing notes.  Pertinent labs & imaging results that were available during my care of the patient were reviewed by me and considered in my medical decision making (see chart for details).     Acute viral sinusitis   Final Clinical Impressions(s) / UC Diagnoses   Final diagnoses:  Acute viral sinusitis     Discharge Instructions     Try ibuprofen for pain. Continue with Sudafed to help break up mucus. You may also try afrin and/or Neti pots to help with symptoms. If symptoms last longer than 14 day or if you begin to develop fevers you may need to be reevaluated for change to bacterial infection.    ED Prescriptions    None     PDMP  not reviewed this encounter.   Domingo Dimes, PA-C 07/02/19 1321

## 2021-03-01 IMAGING — MR MR HEAD WO/W CM
12 series · 48 of 48 positions shown · IV contrast (15 ml multihance)
Comparison: Cervical spine MRI 08/17/2013.

CLINICAL DATA: 49-year-old female with headaches, migraines,
bilateral eye pain for two months. Family history of aneurysm
(mother, sister).

EXAM:
MRI HEAD WITHOUT AND WITH CONTRAST
MRA HEAD WITHOUT CONTRAST
TECHNIQUE: Multiplanar, multiecho pulse sequences of the brain and surrounding
structures were obtained without and with intravenous contrast.
Angiographic images of the head were obtained using MRA technique
without contrast.
CONTRAST:  15mL MULTIHANCE GADOBENATE DIMEGLUMINE 529 MG/ML IV SOLN

[Series 5: T1 · sagittal · 4.0mm · 0.75mm/px · 1 of 31 slices shown (1 of 3)]
[im 1/31]
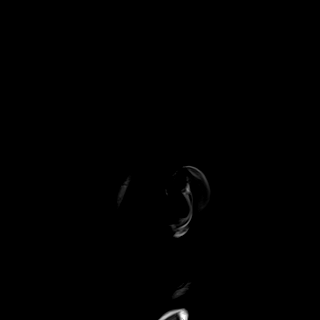

[Series 6: DWI · axial · 3.0mm · 1.44mm/px · z∈[-79,+52]mm · 4 of 84 slices shown (1 of 4)]
[im 1/84]
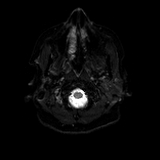
[im 28/84]
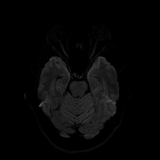
[im 56/84]
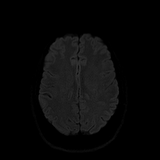
[im 84/84]
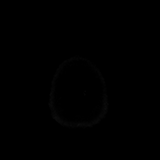

[Series 7: DWI · axial · 3.0mm · 1.44mm/px · z∈[-79,+52]mm · 3 of 42 slices shown (2 of 4)]
[im 1/42]
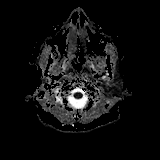
[im 21/42]
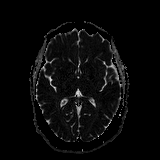
[im 42/42]
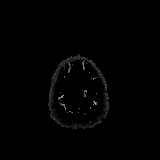

[Series 8: DWI · coronal · 5.0mm · 1.44mm/px · 4 of 60 slices shown (3 of 4)]
[im 1/60]
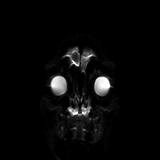
[im 20/60]
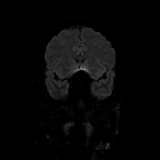
[im 40/60]
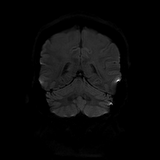
[im 60/60]
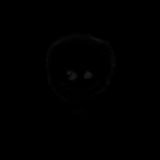

[Series 9: DWI · coronal · 5.0mm · 1.44mm/px · 2 of 30 slices shown (4 of 4)]
[im 1/30]
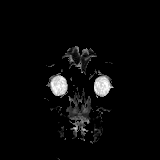
[im 30/30]
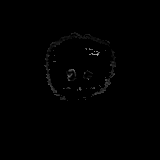

[Series 10: T2 · axial · 4.0mm · 0.36mm/px · z∈[-80,+51]mm · 2 of 27 slices shown]
[im 1/27]
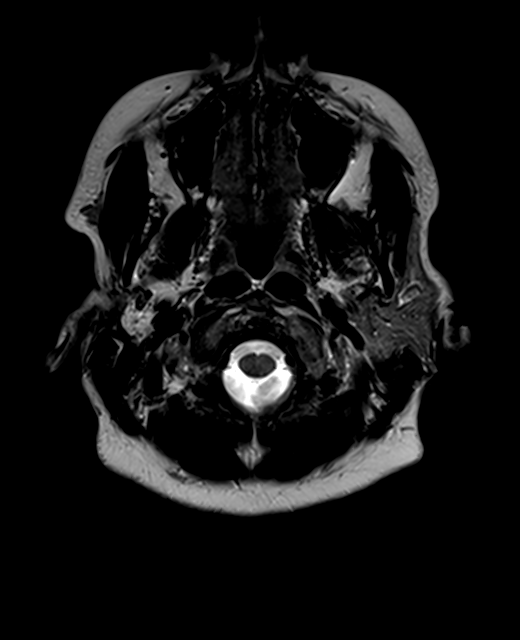
[im 27/27]
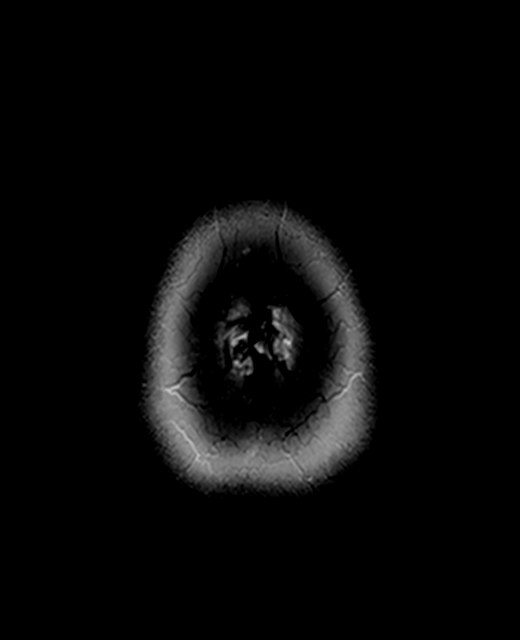

[Series 11: FLAIR · axial · 3.0mm · 0.72mm/px · z∈[-88,+57]mm · 2 of 26 slices shown]
[im 1/26]
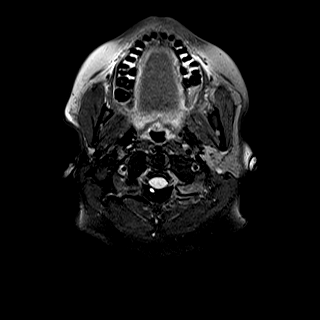
[im 26/26]
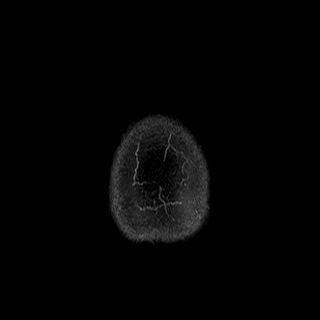

[Series 13: swi_images · axial · 1.5mm · 0.90mm/px · z∈[-84,+54]mm · 6 of 96 slices shown]
[im 1/96]
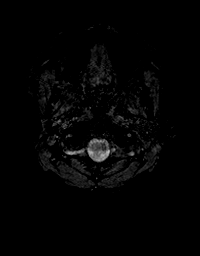
[im 20/96]
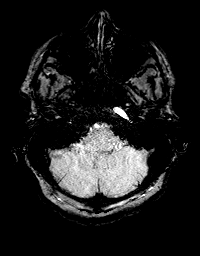
[im 39/96]
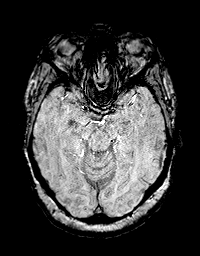
[im 58/96]
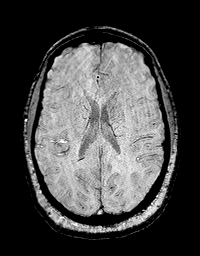
[im 77/96]
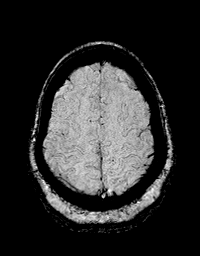
[im 96/96]
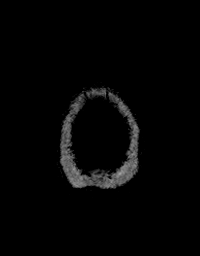

[Series 14: T1 · axial · 1.0mm · 0.94mm/px · z∈[-93,+61]mm · 10 of 160 slices shown (2 of 3)]
[im 1/160]
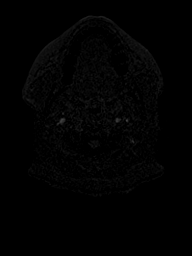
[im 18/160]
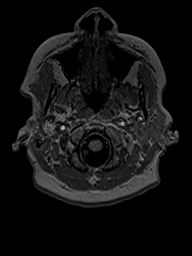
[im 36/160]
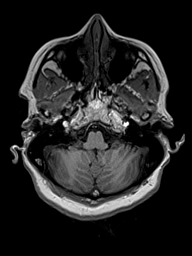
[im 54/160]
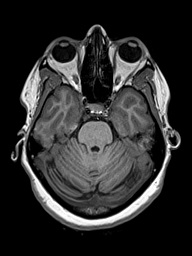
[im 71/160]
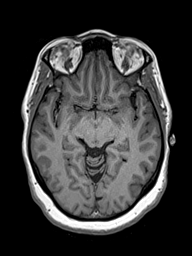
[im 89/160]
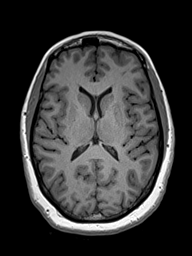
[im 107/160]
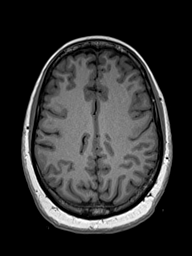
[im 124/160]
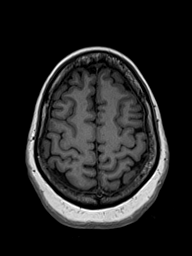
[im 142/160]
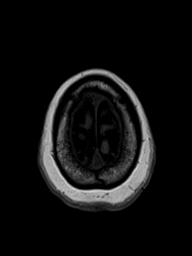
[im 160/160]
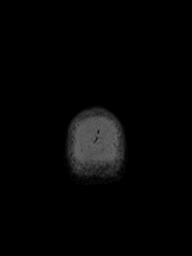

[Series 15: T2 post-contrast · coronal · 4.0mm · 0.36mm/px · 2 of 33 slices shown]
[im 1/33]
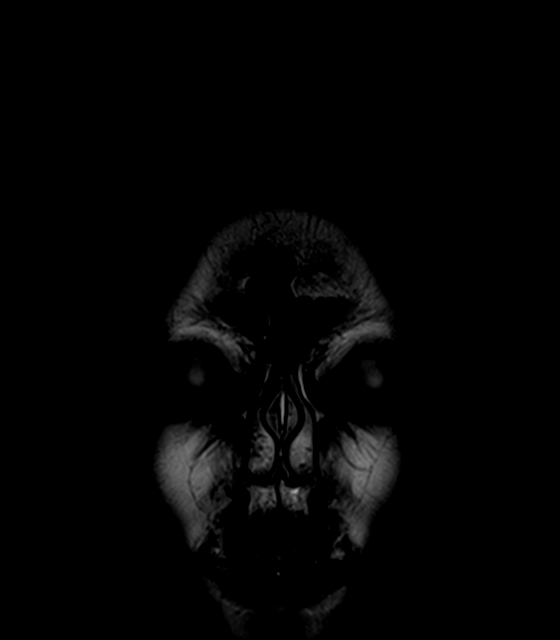
[im 33/33]
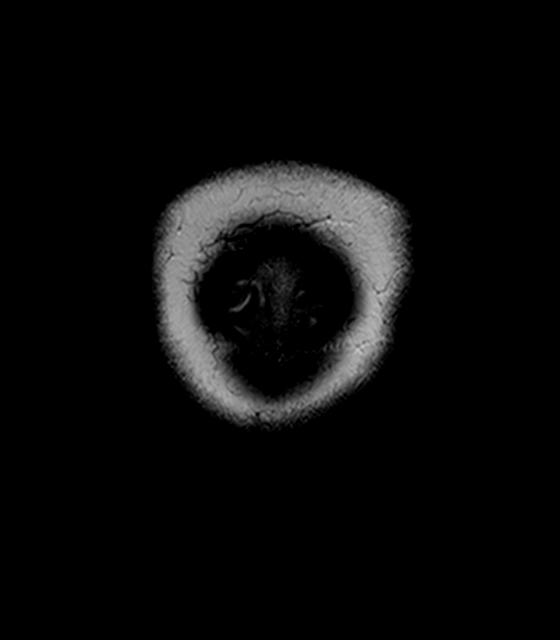

[Series 16: T1 · axial · 1.0mm · 0.94mm/px · z∈[-93,+61]mm · 10 of 160 slices shown (3 of 3)]
[im 1/160]
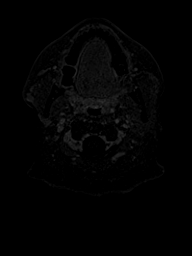
[im 18/160]
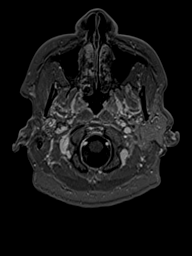
[im 36/160]
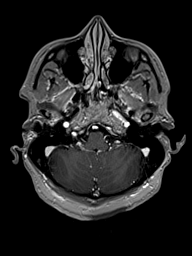
[im 54/160]
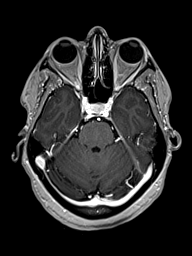
[im 71/160]
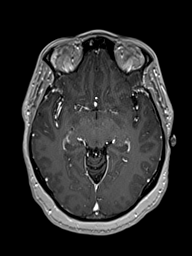
[im 89/160]
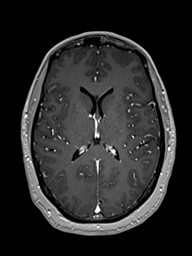
[im 107/160]
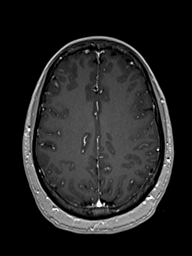
[im 124/160]
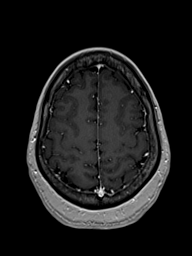
[im 142/160]
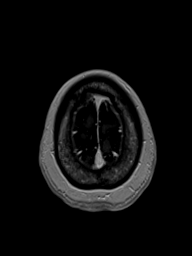
[im 160/160]
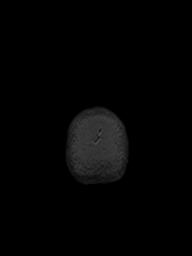

[Series 17: T1 post-contrast · coronal · 4.0mm · 0.72mm/px · 2 of 33 slices shown]
[im 1/33]
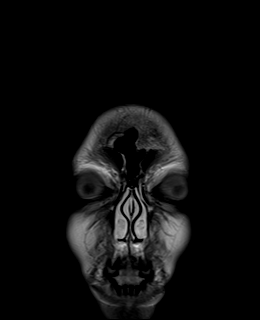
[im 33/33]
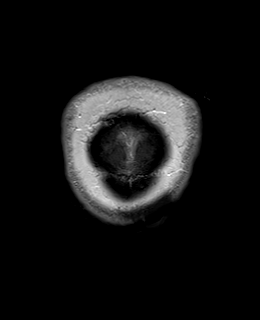

[48 of 48 positions shown; findings below may reference images not displayed]

FINDINGS: MRI HEAD FINDINGS

Brain: No restricted diffusion to suggest acute infarction. No
midline shift, mass effect, evidence of mass lesion,
ventriculomegaly, extra-axial collection or acute intracranial
hemorrhage. Cervicomedullary junction and pituitary are within
normal limits.

Normal cerebral volume. Gray and white matter signal is within
normal limits for age throughout the brain. No chronic cerebral
blood products. No abnormal enhancement identified. No dural
thickening.

Vascular: Major intracranial vascular flow voids are preserved. The
major dural venous sinuses are enhancing and appear to be patent.

Skull and upper cervical spine: Stable visible cervical spine.
Normal bone marrow signal.

Sinuses/Orbits: Negative orbits. Normal cavernous sinus. Paranasal
sinuses are clear.

Other: Mastoids are clear. Visible internal auditory structures
appear normal.

The right parotid gland appears atrophied or surgically absent.
There is some residual accessory parotid tissue overlying the
masseter muscle. Other scalp and face soft tissues appear negative.

MRA HEAD FINDINGS

Antegrade flow in the posterior circulation. Tortuous codominant
distal vertebral arteries without stenosis. Normal left PICA and
dominant appearing right AICA origin. Fenestrated vertebrobasilar
junction (normal variant). Patent mildly tortuous basilar artery.
Normal SCA and PCA origins. Both posterior communicating arteries
are present. The left P1 is tortuous. Bilateral PCA branches are
within normal limits.

Antegrade flow in both ICA siphons. Highly tortuous distal cervical
right ICA just below the skull base (series 4 image 17). Mild siphon
tortuosity otherwise. No siphon stenosis. Normal ophthalmic and
posterior communicating artery origins. Patent carotid termini.
Normal MCA and ACA origins. Anterior communicating artery and
visible ACA branches are within normal limits. Left MCA M1 segment
and bifurcation are normal. Right MCA M1 segment and bifurcation are
normal. Visible bilateral MCA branches are normal.
IMPRESSION: 1. No acute intracranial abnormality. Normal MRI appearance of the
brain.
2. Intracranial MRA is normal aside from arterial tortuosity. No
intracranial aneurysm.

## 2021-12-05 DIAGNOSIS — E7800 Pure hypercholesterolemia, unspecified: Secondary | ICD-10-CM | POA: Diagnosis not present

## 2021-12-05 DIAGNOSIS — Z131 Encounter for screening for diabetes mellitus: Secondary | ICD-10-CM | POA: Diagnosis not present

## 2022-01-02 DIAGNOSIS — Z90711 Acquired absence of uterus with remaining cervical stump: Secondary | ICD-10-CM | POA: Diagnosis not present

## 2022-01-02 DIAGNOSIS — Z01419 Encounter for gynecological examination (general) (routine) without abnormal findings: Secondary | ICD-10-CM | POA: Diagnosis not present

## 2022-01-08 DIAGNOSIS — R238 Other skin changes: Secondary | ICD-10-CM | POA: Diagnosis not present

## 2022-01-16 DIAGNOSIS — Z1231 Encounter for screening mammogram for malignant neoplasm of breast: Secondary | ICD-10-CM | POA: Diagnosis not present

## 2022-01-31 DIAGNOSIS — E7800 Pure hypercholesterolemia, unspecified: Secondary | ICD-10-CM | POA: Diagnosis not present

## 2022-06-02 DIAGNOSIS — Z0000 Encounter for general adult medical examination without abnormal findings: Secondary | ICD-10-CM | POA: Diagnosis not present

## 2022-06-02 DIAGNOSIS — E559 Vitamin D deficiency, unspecified: Secondary | ICD-10-CM | POA: Diagnosis not present

## 2022-06-02 DIAGNOSIS — E7800 Pure hypercholesterolemia, unspecified: Secondary | ICD-10-CM | POA: Diagnosis not present

## 2022-08-14 ENCOUNTER — Encounter (HOSPITAL_COMMUNITY): Payer: Self-pay | Admitting: *Deleted

## 2022-08-14 ENCOUNTER — Ambulatory Visit (HOSPITAL_COMMUNITY)
Admission: EM | Admit: 2022-08-14 | Discharge: 2022-08-14 | Disposition: A | Discharge status: Home / Self Care | Payer: BC Managed Care – PPO | Attending: Family Medicine | Admitting: Family Medicine

## 2022-08-14 DIAGNOSIS — N3000 Acute cystitis without hematuria: Secondary | ICD-10-CM | POA: Diagnosis not present

## 2022-08-14 LAB — POCT URINALYSIS DIP (MANUAL ENTRY)
Bilirubin, UA: NEGATIVE
Glucose, UA: NEGATIVE mg/dL
Ketones, POC UA: NEGATIVE mg/dL
Nitrite, UA: NEGATIVE
Protein Ur, POC: NEGATIVE mg/dL
Spec Grav, UA: 1.015 (ref 1.010–1.025)
Urobilinogen, UA: 0.2 E.U./dL
pH, UA: 5.5 (ref 5.0–8.0)

## 2022-08-14 MED ORDER — NITROFURANTOIN MONOHYD MACRO 100 MG PO CAPS: 100.0000 mg | ORAL_CAPSULE | Freq: Two times a day (BID) | ORAL | 0 refills | Status: AC

## 2022-08-14 MED ORDER — PHENAZOPYRIDINE HCL 100 MG PO TABS: 100.0000 mg | ORAL_TABLET | Freq: Three times a day (TID) | ORAL | 0 refills | Status: AC | PRN

## 2022-08-14 NOTE — ED Provider Notes (Signed)
[facility]    CSN: [id number] Arrival date & time: 08/14/22  4098      History   Chief Complaint Chief Complaint  Patient presents with   Back Pain   Abdominal Pain   Urinary Frequency    HPI Elizabeth Cross is a 52 y.o. female.    Back Pain Associated symptoms: abdominal pain   Abdominal Pain Urinary Frequency Associated symptoms include abdominal pain.  Here for urinary frequency, dysuria, low back pain in suprapubic pain.  Yesterday she noticed urinary frequency.  Then overnight she started having some low back pain and some pain in her pelvic/suprapubic area.  Now she is having burning on urination and incomplete bladder emptying.  No fever or chills or vomiting or nausea.  She has had a hysterectomy  She is allergic to codeine    Past Medical History:  Diagnosis Date   Allergy    Frequent headaches    High cholesterol     Patient Active Problem List   Diagnosis Date Noted   Onychomycosis 07/17/2016    Past Surgical History:  Procedure Laterality Date   ABDOMINAL HYSTERECTOMY      OB History   No obstetric history on file.      Home Medications    Prior to Admission medications   Medication Sig Start Date End Date Taking? Authorizing Provider  nitrofurantoin, macrocrystal-monohydrate, (MACROBID) 100 MG capsule Take 1 capsule (100 mg total) by mouth 2 (two) times daily. 08/14/22  Yes Zenia Resides, MD  phenazopyridine (PYRIDIUM) 100 MG tablet Take 1 tablet (100 mg total) by mouth 3 (three) times daily as needed (urinary pain). 08/14/22  Yes Zenia Resides, MD  rosuvastatin (CRESTOR) 5 MG tablet Take 5 mg by mouth daily. 04/22/16  Yes [provider]    Family History Family History  Family history unknown: Yes    Social History Social History   Tobacco Use   Smoking status: Never   Smokeless tobacco: Never  Vaping Use   Vaping status: Never Used  Substance Use Topics   Alcohol use: No   Drug use: No      Allergies   Codeine   Review of Systems Review of Systems  Gastrointestinal:  Positive for abdominal pain.  Genitourinary:  Positive for frequency.  Musculoskeletal:  Positive for back pain.     Physical Exam Triage Vital Signs ED Triage Vitals  Encounter Vitals Group     BP 08/14/22 0935 107/72     Systolic BP Percentile --      Diastolic BP Percentile --      Pulse Rate 08/14/22 0935 62     Resp 08/14/22 0935 18     Temp 08/14/22 0935 98.3 F (36.8 C)     Temp Source 08/14/22 0935 Oral     SpO2 08/14/22 0935 96 %     Weight --      Height --      Head Circumference --      Peak Flow --      Pain Score 08/14/22 0934 5     Pain Loc --      Pain Education --      Exclude from Growth Chart --    No data found.  Updated Vital Signs BP 107/72 (BP Location: Left Arm)   Pulse 62   Temp 98.3 F (36.8 C) (Oral)   Resp 18   SpO2 96%   Visual Acuity Right Eye Distance:   Left Eye  Distance:   Bilateral Distance:    Right Eye Near:   Left Eye Near:    Bilateral Near:     Physical Exam Vitals reviewed.  Constitutional:      General: She is not in acute distress.    Appearance: She is not toxic-appearing or diaphoretic.  HENT:     Mouth/Throat:     Mouth: Mucous membranes are moist.  Cardiovascular:     Rate and Rhythm: Normal rate and regular rhythm.     Heart sounds: No murmur heard. Pulmonary:     Effort: Pulmonary effort is normal.     Breath sounds: Normal breath sounds.  Abdominal:     Palpations: Abdomen is soft.     Tenderness: There is no abdominal tenderness.  Skin:    Coloration: Skin is not jaundiced or pale.  Neurological:     Mental Status: She is alert and oriented to person, place, and time.  Psychiatric:        Behavior: Behavior normal.      UC Treatments / Results  Labs (all labs ordered are listed, but only abnormal results are displayed) Labs Reviewed  POCT URINALYSIS DIP (MANUAL ENTRY) - Abnormal; Notable for the  following components:      Result Value   Color, UA light yellow (*)    Clarity, UA hazy (*)    Blood, UA small (*)    Leukocytes, UA Moderate (2+) (*)    All other components within normal limits  URINE CULTURE    EKG   Radiology No results found.  Procedures Procedures (including critical care time)  Medications Ordered in UC Medications - No data to display  Initial Impression / Assessment and Plan / UC Course  I have reviewed the triage vital signs and the nursing notes.  Pertinent labs & imaging results that were available during my care of the patient were reviewed by me and considered in my medical decision making (see chart for details).        Urinalysis shows moderate leukocytes and a small amount of red blood cells.  Urine culture is sent  Macrodantin is sent in to treat the UTI and Pyridium sent in to treat the symptoms Final Clinical Impressions(s) / UC Diagnoses   Final diagnoses:  Acute cystitis without hematuria     Discharge Instructions      The urinalysis showed some white blood cells and red blood cells, which is consistent with urinary tract infection  Take nitrofurantoin 100 mg--1 capsule 2 times daily for 5 days  Take Pyridium/phenazopyridine 100 mg--1 tablet 3 times daily as needed for urinary pain.  This medication usually makes the urine orange  Urine culture is sent, and we will notify you if the antibiotic needs to be changed.     ED Prescriptions     Medication Sig Dispense Auth. Provider   nitrofurantoin, macrocrystal-monohydrate, (MACROBID) 100 MG capsule Take 1 capsule (100 mg total) by mouth 2 (two) times daily. 10 capsule Zenia Resides, MD   phenazopyridine (PYRIDIUM) 100 MG tablet Take 1 tablet (100 mg total) by mouth 3 (three) times daily as needed (urinary pain). 10 tablet Marlinda Mike Janace Aris, MD      PDMP not reviewed this encounter.   Zenia Resides, MD 08/14/22 1011

## 2022-08-14 NOTE — ED Triage Notes (Signed)
Pt states she has lower back and abdominal pain toady but urine frequency yesterday. She hasn't taken any meds.

## 2022-08-14 NOTE — Discharge Instructions (Addendum)
The urinalysis showed some white blood cells and red blood cells, which is consistent with urinary tract infection  Take nitrofurantoin 100 mg--1 capsule 2 times daily for 5 days  Take Pyridium/phenazopyridine 100 mg--1 tablet 3 times daily as needed for urinary pain.  This medication usually makes the urine orange  Urine culture is sent, and we will notify you if the antibiotic needs to be changed.

## 2022-08-18 ENCOUNTER — Telehealth: Payer: Self-pay | Admitting: Emergency Medicine

## 2022-08-18 MED ORDER — SULFAMETHOXAZOLE-TRIMETHOPRIM 800-160 MG PO TABS: 1.0000 | ORAL_TABLET | Freq: Two times a day (BID) | ORAL | 0 refills | Status: AC

## 2022-09-16 ENCOUNTER — Ambulatory Visit (INDEPENDENT_AMBULATORY_CARE_PROVIDER_SITE_OTHER): Payer: BC Managed Care – PPO | Admitting: Internal Medicine

## 2022-09-16 ENCOUNTER — Encounter (HOSPITAL_BASED_OUTPATIENT_CLINIC_OR_DEPARTMENT_OTHER): Payer: Self-pay | Admitting: Internal Medicine

## 2022-09-16 VITALS — BP 105/72 | HR 83 | Ht 64.0 in | Wt 159.0 lb

## 2022-09-16 DIAGNOSIS — E7801 Familial hypercholesterolemia: Secondary | ICD-10-CM | POA: Diagnosis not present

## 2022-09-16 NOTE — Patient Instructions (Signed)
Medication Instructions:  Continue current medications  *If you need a refill on your cardiac medications before your next appointment, please call your pharmacy*   Lab Work: Fasting NMR and LP(a)  If you have labs (blood work) drawn today and your tests are completely normal, you will receive your results only by: MyChart Message (if you have MyChart) OR A paper copy in the mail If you have any lab test that is abnormal or we need to change your treatment, we will call you to review the results.   Testing/Procedures: Your physician has requested that you have a Coronary Calcium Score Test.    Follow-Up: At [facility], you and your health needs are our priority.  As part of our continuing mission to provide you with exceptional heart care, we have created designated Provider Care Teams.  These Care Teams include your primary Cardiologist (physician) and Advanced Practice Providers (APPs -  Physician Assistants and Nurse Practitioners) who all work together to provide you with the care you need, when you need it.  We recommend signing up for the patient portal called "MyChart".  Sign up information is provided on this After Visit Summary.  MyChart is used to connect with patients for Virtual Visits (Telemedicine).  Patients are able to view lab/test results, encounter notes, upcoming appointments, etc.  Non-urgent messages can be sent to your provider as well.   To learn more about what you can do with MyChart, go to [email or web address].    Your next appointment:   4 month(s)  Provider:   K. Italy Hilty, MD    Other Instructions

## 2022-09-16 NOTE — Progress Notes (Signed)
LIPID CLINIC CONSULT NOTE  Chief Complaint:  Manage dyslipidemia  Primary Care Physician: Noberto Retort, MD  Primary Cardiologist:  None  HPI:  Elizabeth Cross is a 52 y.o. female who is being seen today for the evaluation of dyslipidemia at the request of Maxie Better, MD. this is a pleasant 52 year old female kindly referred for evaluation and management of dyslipidemia.  She has had longstanding high cholesterol but apparently has not been on any treatment for it.  Recently this was discussed with her OB/GYN and she was referred for evaluation.  Her PCP had started her on some rosuvastatin 10 mg daily but based on her recent labs in May 2024 which showed total cholesterol 286, HDL 82, triglycerides 96 and LDL 188, her rosuvastatin was increased from 10 to 20 mg daily.  Overall she says she feels fairly healthy.  She exercises regularly and eats well.  She is status post hysterectomy.  Of note her mom died suddenly at a younger age from an aneurysm, I suspect aortic aneurysm.  He has not had any screening for this.  It was known that she had high cholesterol as well suggesting that this is likely a genetic dyslipidemia.  If you consider her LDL cholesterol of just below 190 on 10 mg of rosuvastatin her LDL untreated would be likely well above 200 consistent with heterozygous familial hyperlipidemia.  PMHx:  Past Medical History:  Diagnosis Date   Allergy    Frequent headaches    High cholesterol     Past Surgical History:  Procedure Laterality Date   ABDOMINAL HYSTERECTOMY      FAMHx:  Family History  Problem Relation Age of Onset   Cancer Father        Prostate    SOCHx:   reports that she has never smoked. She has never used smokeless tobacco. She reports that she does not drink alcohol and does not use drugs.  ALLERGIES:  Allergies  Allergen Reactions   Codeine     ROS: Pertinent items noted in HPI and remainder of comprehensive ROS otherwise  negative.  HOME MEDS: Current Outpatient Medications on File Prior to Visit  Medication Sig Dispense Refill   rosuvastatin (CRESTOR) 10 MG tablet Take 10 mg by mouth at bedtime.     nitrofurantoin, macrocrystal-monohydrate, (MACROBID) 100 MG capsule Take 1 capsule (100 mg total) by mouth 2 (two) times daily. (Patient not taking: Reported on 09/16/2022) 10 capsule 0   phenazopyridine (PYRIDIUM) 100 MG tablet Take 1 tablet (100 mg total) by mouth 3 (three) times daily as needed (urinary pain). (Patient not taking: Reported on 09/16/2022) 10 tablet 0   No current facility-administered medications on file prior to visit.    LABS/IMAGING: No results found for this or any previous visit (from the past 48 hour(s)). No results found.  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   [name]  has a probable familial hyperlipidemia.  On 10 mg of rosuvastatin her LDL was still 188.  She is currently on 20 mg and has some occasional myalgias with it but it is well-tolerated.  I would like to repeat lipids and she has been on this therapy for several months including an NMR in LP(a).  Will also obtain a calcium score to determine if she has any underlying coronary artery disease.  She will need additional aggressive therapy to try to lower her LDL to below 70 if possible.  I suspect that we will be a PCSK9 inhibitor.  We did  discuss the mechanisms of that today as well as the risks and benefits and potential side effects of the medication and she would be agreeable to proceed.  We also discussed possible genetic testing however she like to consider that further before committing to it. I will contact her with the results of the studies and we will plan follow-up in about 4 months or so.  Thanks again for the kind referral.  Chrystie Nose, MD, [facility]    [facility]  Medical Director of the Advanced Lipid Disorders &  Cardiovascular Risk Reduction Clinic Diplomate of the American Board of Clinical Lipidology Attending Cardiologist  Direct Dial: [phone]  Fax: [phone]  Website:  [email or web address] Nicely Renata Gambino 09/16/2022, 11:00 AM

## 2022-10-07 ENCOUNTER — Ambulatory Visit (HOSPITAL_BASED_OUTPATIENT_CLINIC_OR_DEPARTMENT_OTHER)
Admission: RE | Admit: 2022-10-07 | Discharge: 2022-10-07 | Disposition: A | Discharge status: Home / Self Care | Payer: BC Managed Care – PPO | Source: Ambulatory Visit | Attending: Internal Medicine | Admitting: Internal Medicine

## 2022-10-07 DIAGNOSIS — E7801 Familial hypercholesterolemia: Secondary | ICD-10-CM | POA: Insufficient documentation

## 2022-11-05 DIAGNOSIS — H6121 Impacted cerumen, right ear: Secondary | ICD-10-CM | POA: Diagnosis not present

## 2022-11-05 DIAGNOSIS — J329 Chronic sinusitis, unspecified: Secondary | ICD-10-CM | POA: Diagnosis not present

## 2022-11-05 DIAGNOSIS — J029 Acute pharyngitis, unspecified: Secondary | ICD-10-CM | POA: Diagnosis not present

## 2023-01-22 DIAGNOSIS — Z1231 Encounter for screening mammogram for malignant neoplasm of breast: Secondary | ICD-10-CM | POA: Diagnosis not present

## 2023-03-31 DIAGNOSIS — Z01419 Encounter for gynecological examination (general) (routine) without abnormal findings: Secondary | ICD-10-CM | POA: Diagnosis not present

## 2023-04-30 DIAGNOSIS — J029 Acute pharyngitis, unspecified: Secondary | ICD-10-CM | POA: Diagnosis not present

## 2023-04-30 DIAGNOSIS — J302 Other seasonal allergic rhinitis: Secondary | ICD-10-CM | POA: Diagnosis not present

## 2023-04-30 DIAGNOSIS — J069 Acute upper respiratory infection, unspecified: Secondary | ICD-10-CM | POA: Diagnosis not present

## 2023-05-21 DIAGNOSIS — J301 Allergic rhinitis due to pollen: Secondary | ICD-10-CM | POA: Diagnosis not present

## 2023-08-19 DIAGNOSIS — E7800 Pure hypercholesterolemia, unspecified: Secondary | ICD-10-CM | POA: Diagnosis not present

## 2023-08-19 DIAGNOSIS — Z0000 Encounter for general adult medical examination without abnormal findings: Secondary | ICD-10-CM | POA: Diagnosis not present

## 2023-08-19 DIAGNOSIS — M79672 Pain in left foot: Secondary | ICD-10-CM | POA: Diagnosis not present

## 2023-08-19 DIAGNOSIS — E559 Vitamin D deficiency, unspecified: Secondary | ICD-10-CM | POA: Diagnosis not present

## 2023-09-10 ENCOUNTER — Encounter: Payer: Self-pay | Admitting: Podiatry

## 2023-09-10 ENCOUNTER — Ambulatory Visit (INDEPENDENT_AMBULATORY_CARE_PROVIDER_SITE_OTHER)

## 2023-09-10 ENCOUNTER — Ambulatory Visit (INDEPENDENT_AMBULATORY_CARE_PROVIDER_SITE_OTHER): Admitting: Podiatry

## 2023-09-10 DIAGNOSIS — M778 Other enthesopathies, not elsewhere classified: Secondary | ICD-10-CM

## 2023-09-10 DIAGNOSIS — Q666 Other congenital valgus deformities of feet: Secondary | ICD-10-CM | POA: Diagnosis not present

## 2023-09-10 DIAGNOSIS — M21622 Bunionette of left foot: Secondary | ICD-10-CM

## 2023-09-10 MED ORDER — MELOXICAM 7.5 MG PO TABS: 7.5000 mg | ORAL_TABLET | Freq: Every day | ORAL | 0 refills | Status: DC

## 2023-09-10 NOTE — Patient Instructions (Signed)
 Look at [facility] or [facility] for around the house  You can use VOLTAREN GEL if not using the oral ant-inflammatory (meloxicam )  If you have any questions or any further concerns, please feel fee to give me a call. You can call our office at [phone] or please feel fee to send me a message through MyChart.

## 2023-09-10 NOTE — Progress Notes (Signed)
  Subjective:  Patient ID: Elizabeth Cross, female    DOB: 1970/07/22,  MRN: [id number]  Chief Complaint  Patient presents with   Bunions    Left foot tailors Bunion x 2 months. 10 pain. Non diabetic. Taken Tylenol and rolling foot on a ball but didn't help.     Discussed the use of AI scribe software for clinical note transcription with the patient, who gave verbal consent to proceed.  History of Present Illness Elizabeth Cross is a 53 year old female who presents with left foot pain.  She experiences sharp, severe pain in her left foot, radiating upwards, lasting about 20 minutes, and persisting for almost two months. The pain began without specific injury, although she recalls twisting her foot slightly when [place] an airport.  She did not have any pain at the time.  She started using a stair stepper in May.  The pain worsens with flat sandals, especially her [name] sandals, and sometimes occurs when standing. It is less severe with supportive shoes. Tylenol has not relieved the pain.  She does not work and often wears socks at home, which may contribute to inadequate foot support.      Objective:    Physical Exam General: AAO x3, NAD  Dermatological: Skin is warm, dry and supple bilateral.  There are no open sores, no preulcerative lesions, no rash or signs of infection present.  Vascular: Dorsalis Pedis artery and Posterior Tibial artery pedal pulses are 2/4 bilateral with immedate capillary fill time.  There is no pain with calf compression, swelling, warmth, erythema.   Neruologic: Grossly intact via light touch bilateral.   Musculoskeletal: Decreased medial arch height bilaterally. There is tenderness to palpation along the 5th metatarsal head along an area of a tailors bunion. She also gets discomfort along the 4th/5th metatarsal cuboid joint. No other areas of discomfort at this time.  Gait: Unassisted, Nonantalgic.     No images are attached to  the encounter.    Results RADIOLOGY Foot X-ray: There is no evidence of acute fracture noted today.  Mild tailor's bunion is present.  There are some slight narrowing along the fourth/fifth metatarsal cuboid joint   Assessment:   1. Tailor's bunion of left foot   2. Pes planovalgus   3. Capsulitis of left foot      Plan:  Patient was evaluated and treated and all questions answered.  Assessment and Plan Assessment & Plan  - Prescribed meloxicam  7.5 mg once daily as needed for inflammation. - Advised against additional use of ibuprofen , Motrin , or Aleve while on meloxicam . - Recommended icing the foot for 20 minutes a couple of times a day, especially in the evening. - Advised wearing supportive shoes with good arch support, avoiding flat sandals. - Suggested considering brands like Oofos or Vionic for supportive house shoes or sandals. - Discussed the option of a steroid injection if needed for persistent inflammation. - Recommended using Voltaren gel as a topical anti-inflammatory alternative.   No follow-ups on file.   Donnice JONELLE Fees DPM

## 2023-10-07 ENCOUNTER — Other Ambulatory Visit: Payer: Self-pay | Admitting: Podiatry

## 2023-10-14 ENCOUNTER — Telehealth: Payer: Self-pay | Admitting: Podiatry

## 2023-10-14 NOTE — Telephone Encounter (Signed)
 error

## 2023-10-14 NOTE — Telephone Encounter (Signed)
 Patient is experiences sharp, severe pain in her left foot, radiating upwards, lasting about 20 minutes, she takes the meloxicam  but not daily because it causes stomach issues.On the days she doesn't take medication she deals with the pain.She is unable to exercise at this point.  Patients asks could you approve an MRI for the foot ?

## 2023-10-19 ENCOUNTER — Other Ambulatory Visit: Payer: Self-pay | Admitting: Podiatry

## 2023-10-19 DIAGNOSIS — M778 Other enthesopathies, not elsewhere classified: Secondary | ICD-10-CM

## 2023-11-04 ENCOUNTER — Ambulatory Visit
Admission: RE | Admit: 2023-11-04 | Discharge: 2023-11-04 | Disposition: A | Discharge status: Home / Self Care | Source: Ambulatory Visit | Attending: Podiatry | Admitting: Podiatry

## 2023-11-04 ENCOUNTER — Inpatient Hospital Stay: Admission: RE | Admit: 2023-11-04 | Source: Ambulatory Visit

## 2023-11-04 DIAGNOSIS — M19072 Primary osteoarthritis, left ankle and foot: Secondary | ICD-10-CM | POA: Diagnosis not present

## 2023-11-04 DIAGNOSIS — M778 Other enthesopathies, not elsewhere classified: Secondary | ICD-10-CM

## 2023-11-05 ENCOUNTER — Telehealth: Payer: Self-pay | Admitting: Lab

## 2023-11-05 NOTE — Telephone Encounter (Signed)
 Patient is requesting letter stating is no longer able to exercise due to pain so she can get out of her gym membership .

## 2023-11-06 ENCOUNTER — Ambulatory Visit: Payer: Self-pay | Admitting: Podiatry

## 2023-11-06 ENCOUNTER — Other Ambulatory Visit: Payer: Self-pay | Admitting: Podiatry

## 2023-11-06 ENCOUNTER — Encounter: Payer: Self-pay | Admitting: Podiatry

## 2023-11-06 ENCOUNTER — Telehealth: Payer: Self-pay | Admitting: Lab

## 2023-11-06 MED ORDER — MELOXICAM 15 MG PO TABS: 15.0000 mg | ORAL_TABLET | Freq: Every day | ORAL | 0 refills | Status: AC | PRN

## 2023-11-06 NOTE — Telephone Encounter (Signed)
 Patient calling about MRI results wants to discuss treatment plan states is in a lot of pain please advise.

## 2023-11-12 DIAGNOSIS — Z0279 Encounter for issue of other medical certificate: Secondary | ICD-10-CM
